# Patient Record
Sex: Male | Born: 1955 | Race: White | Hispanic: No | Marital: Married | Smoking: Never smoker
Health system: Southern US, Community
[De-identification: ages and names within clinical notes are randomized; demographics above are authoritative.]

## PROBLEM LIST (undated history)

## (undated) DIAGNOSIS — E7800 Pure hypercholesterolemia, unspecified: Secondary | ICD-10-CM

## (undated) DIAGNOSIS — N200 Calculus of kidney: Secondary | ICD-10-CM

## (undated) DIAGNOSIS — R748 Abnormal levels of other serum enzymes: Secondary | ICD-10-CM

## (undated) DIAGNOSIS — D8689 Sarcoidosis of other sites: Secondary | ICD-10-CM

## (undated) HISTORY — PX: CHOLECYSTECTOMY: SHX55

## (undated) HISTORY — PX: TONSILLECTOMY: SUR1361

---

## 2004-06-15 ENCOUNTER — Ambulatory Visit: Payer: Self-pay | Admitting: Family Medicine

## 2005-04-27 ENCOUNTER — Ambulatory Visit: Payer: Self-pay | Admitting: Family Medicine

## 2005-05-08 ENCOUNTER — Ambulatory Visit: Payer: Self-pay | Admitting: Family Medicine

## 2005-07-16 ENCOUNTER — Ambulatory Visit: Payer: Self-pay | Admitting: Family Medicine

## 2005-08-31 ENCOUNTER — Ambulatory Visit: Payer: Self-pay | Admitting: Family Medicine

## 2011-04-04 ENCOUNTER — Ambulatory Visit (HOSPITAL_COMMUNITY)
Admission: EM | Admit: 2011-04-04 | Discharge: 2011-04-04 | Disposition: A | Discharge status: Home / Self Care | Payer: Worker's Compensation | Attending: Orthopedic Surgery | Admitting: Orthopedic Surgery

## 2011-04-04 ENCOUNTER — Emergency Department (HOSPITAL_COMMUNITY): Payer: Worker's Compensation

## 2011-04-04 DIAGNOSIS — S61209A Unspecified open wound of unspecified finger without damage to nail, initial encounter: Secondary | ICD-10-CM | POA: Insufficient documentation

## 2011-04-04 DIAGNOSIS — X58XXXA Exposure to other specified factors, initial encounter: Secondary | ICD-10-CM | POA: Insufficient documentation

## 2011-04-04 DIAGNOSIS — S62639A Displaced fracture of distal phalanx of unspecified finger, initial encounter for closed fracture: Secondary | ICD-10-CM | POA: Insufficient documentation

## 2011-04-04 LAB — CBC
HCT: 48.7 % (ref 39.0–52.0)
Hemoglobin: 17 g/dL (ref 13.0–17.0)
MCV: 85.4 fL (ref 78.0–100.0)
WBC: 7.1 10*3/uL (ref 4.0–10.5)

## 2011-04-04 LAB — RAPID URINE DRUG SCREEN, HOSP PERFORMED
Barbiturates: NOT DETECTED
Benzodiazepines: NOT DETECTED
Cocaine: NOT DETECTED
Tetrahydrocannabinol: NOT DETECTED

## 2011-04-04 LAB — BASIC METABOLIC PANEL
BUN: 12 mg/dL (ref 6–23)
CO2: 23 mEq/L (ref 19–32)
Chloride: 102 mEq/L (ref 96–112)
GFR calc Af Amer: >90 mL/min (ref >90)
Potassium: 4.1 mEq/L (ref 3.5–5.1)

## 2011-04-18 NOTE — Op Note (Signed)
Javier Cruz, Javier Cruz NO.:  [id number]  MEDICAL RECORD NO.:  [id number]  LOCATION:  WLED                         FACILITY:  [facility]  PHYSICIAN:  Madelynn Done, MD  DATE OF BIRTH:  1956-03-23  DATE OF PROCEDURE:  04/04/2011 DATE OF DISCHARGE:                              OPERATIVE REPORT   PREOPERATIVE DIAGNOSES: 1. Open left thumb distal phalanx fracture. 2. Left thumb nail bed injury. 3. Traumatic laceration, 4.5 cm, left thumb.  POSTOPERATIVE DIAGNOSES: 1. Open left thumb distal phalanx fracture. 2. Left thumb nail bed injury. 3. Traumatic laceration, 4.5 cm, left thumb.  ATTENDING PHYSICIAN:  Sharma Covert IV, MD, who was scrubbed and present for the entire procedure.  ASSISTANT SURGEON:  None.  ANESTHESIA:  General via LMA.  TOURNIQUET TIME:  Close to 30 minutes, 250 mmHg.  SURGICAL PROCEDURES: 1. Open treatment of left thumb distal phalanx fracture with internal     fixation. 2. Repair of left thumb nail bed. 3. Debridement of skin, subcutaneous tissue, and bone associated with     open fracture of left thumb. 4. Radiographs, 2 views, left thumb. 5. Repair of traumatic laceration, 4.5 cm.  INTRAOPERATIVE FINDINGS:  The patient did have the avulsion type injury distally, and neurovascular pedicles were still in the large skin flap. The avulsion was very similar in appearance to how one would create a Moberg type flap taking the large skin off the flexor sheath and maintaining the neurovascular pedicles.  SURGICAL INDICATIONS:  Javier Cruz is a 55 year old right-hand-dominant gentleman who sustained a work-related injury to his left thumb.  The patient was seen and evaluated in the office, in the emergency department, and taken to the operating to undergo the above procedure. Risks, benefits, and alternatives were discussed in detail with the patient who signed and informed consent was obtained.  Risks to include, but not limited to  bleeding, infection; damage to nearby nerves, arteries, or tendons; nonunion; malunion; nail bed irregularity; loss of motion of thumb; and need for further surgical intervention.  DESCRIPTION OF PROCEDURE:  The patient was properly identified in the preop holding area, mark for a marker made on the left thumb to indicate correct operative site.  The patient was then brought back to the operating room, placed supine on anesthesia table where general anesthesia was administered.  The patient received preop antibiotics. Well-padded tourniquet was then placed on the left brachium and sealed with 1000 drape.  Left upper extremity was then prepped and draped in the normal sterile fashion.  Time-out was called.  Correct site was identified, and procedure then begun.  Attention was then turned to the left thumb.  Limb was then elevated and tourniquet insufflated.  The fracture hematoma was then evacuated.  Debridement of skin, subcutaneous tissue, and bone associated with open fracture was then carried out including several small pieces of the distal phalanx.  Once open debridement was then carried out, attention was then turned to fixation of the bone.  A 0.045 K-wire was then placed retrograde across the fracture site, maintaining a fine and good position, maintaining the thumb tip of the length.  Once this was carried out, repair  of the nail bed was then carried out using 5-0 simple chromic sutures.  After repair of the nail bed, the traumatic laceration was then closed with 4-0 Prolene sutures advancing skin flaps __________ appropriate alignment. After the closure, after each layer, after each procedure, the wound was then thoroughly irrigated.  Copious irrigation was done throughout. __________ the neurovascular pedicles were both in continuity. Following this, the K-wire was cut and final radiographs were then obtained using a C-arm, the Adaptic dressing was then applied.   Sterile compressive bandage then applied.  The patient was then placed in a well- padded thumb spica splint.  The patient was then extubated and taken to recovery room in good condition.  Intraoperative radiographs, 2 views, of the thumb do show the internal fixation in place, relatively good position with a comminuted bony fragment.  POSTOPERATIVE PLAN:  The patient will be discharged to home; seen back in the office in approximately 10 days, in 7 days for pin check, and pin out at the 3-week mark.  X-rays at each visit.  Tip protector splint; and then once the pin comes out, to a little bit more graduated __________ range of motion of the thumb.     Madelynn Done, MD     FWO/MEDQ  D:  04/04/2011  T:  04/04/2011  Job:  [id number]  Electronically Signed by Bradly Bienenstock IV MD on 04/18/2011 04:20:09 PM

## 2011-04-18 NOTE — H&P (Signed)
Javier, Cruz NO.:  [id number]  MEDICAL RECORD NO.:  [id number]  LOCATION:  1610                         FACILITY:  [facility]  PHYSICIAN:  Madelynn Done, MD  DATE OF BIRTH:  02/25/1956  DATE OF ADMISSION:  04/04/2011 DATE OF DISCHARGE:                             HISTORY & PHYSICAL   REASON FOR ADMISSION:  Left thumb open crush injury.  BRIEF HISTORY:  [name] is a 55 year old gentleman, who was at work sustaining an injury to his left thumb.  The patient had a several hundred pound beam come down and crush his left thumb.  Patient was seen and evaluated in the emergency department by Dr. Preston Fleeting and staff.  After being seen, I was asked to see and evaluate the patient.  After evaluation, indicated to be taken to the operating room to undergo reconstruction of his thumb.  No other complaints other than the left thumb.  No chest pain or loss of conscious.  No other complaints.  PAST MEDICAL HISTORY:  Reflux disease.  His past medications, allergies, social history, family history, and review of systems has been reviewed in the note by Remi Haggard, nurse practitioner, and Dr. Preston Fleeting.  PHYSICAL EXAMINATION:  GENERAL:  He is a healthy-appearing male. VITAL SIGN:  Temperature 98.7, blood pressure 169/100, heart rate of 120, respirations 18. NEUROLOGIC:  He has a normal mood. He is alert and oriented to person, place, and time, in no acute distress. HEENT:  Atraumatic.  Pupils equal, round.  Sclerae are white. NECK:  No visible masses. CHEST:  Equal chest excursion.  No audible wheezing. CARDIOVASCULAR:  Regular radial pulse.  Good blood flow distally in the tip of the finger. EXTREMITIES: On examination, in the left upper extremity, the patient does have the open wound to his left thumb along the volar radial margin with the laceration all the way down to the bone, insensate distal tip on the radial margin, distal to the zone of injury.  Sensation  is present on the ulnar aspect.  He is able to flex his thumb ip joint. Good capillary refill.  He does not have any injury to the index, long, ring, or small.  He has good wrist flexion, extension, as well as forearm rotation. His radiographs were reviewed, which did show a comminuted distal tuft fracture and associated soft tissue injury to the thumb.  IMPRESSION:  Left thumb open distal phalanx fracture with a digital nerve injury and nail bed injury.  PLAN:  Today, the findings were reviewed with the patient.  The patient will be taken to the operating room to undergo reconstruction of his thumb.  The debridement and repair as indicated to the nerves and bone. We talked about the risks of surgery to include, but not limited to bleeding, infection, damage to nearby nerves, arteries, or tendons, loss of motion of wrist and digits, and need for further surgical intervention.  All questions were answered, encouraged Lamine today, and the patient received a tetanus shot, IV antibiotics.  His last meal was early this morning.  The patient will be discharged to home.  He will not be admitted.  All questions were answered,  encouraged Shadman today.  Therin voiced understanding of plan and the reason for the intervention. Surgical decision making was carried out.     Madelynn Done, MD     FWO/MEDQ  D:  04/04/2011  T:  04/04/2011  Job:  [id number]  Electronically Signed by Bradly Bienenstock IV MD on 04/18/2011 04:19:46 PM

## 2012-03-14 DIAGNOSIS — Z23 Encounter for immunization: Secondary | ICD-10-CM

## 2013-04-13 ENCOUNTER — Ambulatory Visit: Payer: BC Managed Care – PPO

## 2017-04-25 DIAGNOSIS — Z01818 Encounter for other preprocedural examination: Secondary | ICD-10-CM

## 2021-08-14 ENCOUNTER — Other Ambulatory Visit: Payer: Self-pay

## 2021-08-14 ENCOUNTER — Emergency Department (HOSPITAL_BASED_OUTPATIENT_CLINIC_OR_DEPARTMENT_OTHER): Payer: Medicare HMO

## 2021-08-14 ENCOUNTER — Emergency Department (HOSPITAL_BASED_OUTPATIENT_CLINIC_OR_DEPARTMENT_OTHER)
Admission: EM | Admit: 2021-08-14 | Discharge: 2021-08-14 | Disposition: A | Discharge status: Home / Self Care | Payer: Medicare HMO | Attending: Emergency Medicine | Admitting: Emergency Medicine

## 2021-08-14 ENCOUNTER — Encounter (HOSPITAL_BASED_OUTPATIENT_CLINIC_OR_DEPARTMENT_OTHER): Payer: Self-pay | Admitting: Emergency Medicine

## 2021-08-14 DIAGNOSIS — R197 Diarrhea, unspecified: Secondary | ICD-10-CM | POA: Insufficient documentation

## 2021-08-14 DIAGNOSIS — R110 Nausea: Secondary | ICD-10-CM | POA: Insufficient documentation

## 2021-08-14 DIAGNOSIS — R42 Dizziness and giddiness: Secondary | ICD-10-CM | POA: Diagnosis not present

## 2021-08-14 DIAGNOSIS — R109 Unspecified abdominal pain: Secondary | ICD-10-CM | POA: Insufficient documentation

## 2021-08-14 HISTORY — DX: Abnormal levels of other serum enzymes: R74.8

## 2021-08-14 HISTORY — DX: Pure hypercholesterolemia, unspecified: E78.00

## 2021-08-14 HISTORY — DX: Sarcoidosis of other sites: D86.89

## 2021-08-14 HISTORY — DX: Calculus of kidney: N20.0

## 2021-08-14 LAB — COMPREHENSIVE METABOLIC PANEL
ALT: 29 U/L (ref 0–44)
AST: 23 U/L (ref 15–41)
Albumin: 4.3 g/dL (ref 3.5–5.0)
Alkaline Phosphatase: 83 U/L (ref 38–126)
Anion gap: 11 (ref 5–15)
BUN: 20 mg/dL (ref 8–23)
CO2: 27 mmol/L (ref 22–32)
Calcium: 9.9 mg/dL (ref 8.9–10.3)
Chloride: 99 mmol/L (ref 98–111)
Creatinine, Ser: 1.17 mg/dL (ref 0.61–1.24)
GFR, Estimated: >60 mL/min (ref >60)
Glucose, Bld: 126 mg/dL — ABNORMAL HIGH (ref 70–99)
Potassium: 4.1 mmol/L (ref 3.5–5.1)
Sodium: 137 mmol/L (ref 135–145)
Total Bilirubin: 0.9 mg/dL (ref 0.3–1.2)
Total Protein: 8.9 g/dL — ABNORMAL HIGH (ref 6.5–8.1)

## 2021-08-14 LAB — URINALYSIS, ROUTINE W REFLEX MICROSCOPIC
Bilirubin Urine: NEGATIVE
Glucose, UA: NEGATIVE mg/dL
Hgb urine dipstick: NEGATIVE
Ketones, ur: NEGATIVE mg/dL
Leukocytes,Ua: NEGATIVE
Nitrite: NEGATIVE
Protein, ur: 30 mg/dL — AB
Specific Gravity, Urine: >1.03 — ABNORMAL HIGH (ref 1.005–1.030)
pH: 5 (ref 5.0–8.0)

## 2021-08-14 LAB — URINALYSIS, MICROSCOPIC (REFLEX)

## 2021-08-14 LAB — CBC
HCT: 55.4 % — ABNORMAL HIGH (ref 39.0–52.0)
Hemoglobin: 18.7 g/dL — ABNORMAL HIGH (ref 13.0–17.0)
MCH: 29.8 pg (ref 26.0–34.0)
MCHC: 33.8 g/dL (ref 30.0–36.0)
MCV: 88.2 fL (ref 80.0–100.0)
Platelets: 258 10*3/uL (ref 150–400)
RBC: 6.28 MIL/uL — ABNORMAL HIGH (ref 4.22–5.81)
RDW: 13.1 % (ref 11.5–15.5)
WBC: 11.4 10*3/uL — ABNORMAL HIGH (ref 4.0–10.5)
nRBC: 0 % (ref 0.0–0.2)

## 2021-08-14 LAB — LIPASE, BLOOD: Lipase: 44 U/L (ref 11–51)

## 2021-08-14 MED ORDER — IOHEXOL 300 MG/ML  SOLN
100.0000 mL | Freq: Once | INTRAMUSCULAR | Status: AC | PRN
  Administered 2021-08-14: 100 mL via INTRAVENOUS

## 2021-08-14 MED ORDER — SODIUM CHLORIDE 0.9 % IV BOLUS
1000.0000 mL | Freq: Once | INTRAVENOUS | Status: AC
  Administered 2021-08-14: 1000 mL via INTRAVENOUS

## 2021-08-14 MED ORDER — DICYCLOMINE HCL 20 MG PO TABS: 20.0000 mg | ORAL_TABLET | Freq: Two times a day (BID) | ORAL | 0 refills | Status: AC

## 2021-08-14 MED ORDER — ONDANSETRON 4 MG PO TBDP: 4.0000 mg | ORAL_TABLET | Freq: Three times a day (TID) | ORAL | 0 refills | Status: AC | PRN

## 2021-08-14 NOTE — ED Notes (Signed)
Rx x 2 given  Written and verbal inst to pt  Verbalized an understanding  To home with family 

## 2021-08-14 NOTE — Discharge Instructions (Addendum)
Please follow-up with your primary care doctor.  I have given you the information for a gastroenterologist as well if you would like to follow-up with them.  I prescribed you muscle relaxer called Bentyl that may help with cramping.  Drink plenty of water you Zofran as needed for nausea.

## 2021-08-14 NOTE — ED Provider Notes (Signed)
[facility] EMERGENCY DEPARTMENT Provider Note   CSN: [id number] Arrival date & time: 08/14/21  1513     History  Chief Complaint  Patient presents with   Abdominal Pain    ROERT CULLIGAN is a 66 y.o. male.   Abdominal Pain Patient is a 66 year old male with past medical history significant for kidney stones, elevated LFTs, hepatic granuloma, high cholesterol  Patient presented emergency room today with right side abdominal discomfort ongoing for the whole day today.  Some brief episodes of feeling somewhat lightheaded no vomiting but does endorse some occasional episodes of nausea.  States he had 1 episode of dark green loose stool.  No fevers at home.  No chest pain difficulty breathing.  No recent antibiotics.  No other associate symptoms.  No aggravating or mitigating factors     Home Medications Prior to Admission medications   Medication Sig Start Date End Date Taking? Authorizing Provider  dicyclomine (BENTYL) 20 MG tablet Take 1 tablet (20 mg total) by mouth 2 (two) times daily. 08/14/21  Yes Demari Kropp S, PA  ondansetron (ZOFRAN-ODT) 4 MG disintegrating tablet Take 1 tablet (4 mg total) by mouth every 8 (eight) hours as needed for nausea or vomiting. 08/14/21  Yes Tedd Sias, PA      Allergies    Patient has no known allergies.    Review of Systems   Review of Systems  Gastrointestinal:  Positive for abdominal pain.   Physical Exam Updated Vital Signs BP 133/87 (BP Location: Right Arm)    Pulse 87    Temp (!) 97.4 F (36.3 C) (Oral)    Resp 16    Ht 5\' 9"  (1.753 m)    Wt 86.2 kg    SpO2 95%    BMI 28.06 kg/m  Physical Exam Vitals and nursing note reviewed.  Constitutional:      General: He is not in acute distress. HENT:     Head: Normocephalic and atraumatic.     Nose: Nose normal.  Eyes:     General: No scleral icterus. Cardiovascular:     Rate and Rhythm: Normal rate and regular rhythm.     Pulses: Normal pulses.     Heart sounds:  Normal heart sounds.  Pulmonary:     Effort: Pulmonary effort is normal. No respiratory distress.     Breath sounds: No wheezing.  Abdominal:     Palpations: Abdomen is soft.     Tenderness: There is abdominal tenderness.     Comments: R sided mild abd TTP  Musculoskeletal:     Cervical back: Normal range of motion.     Right lower leg: No edema.     Left lower leg: No edema.  Skin:    General: Skin is warm and dry.     Capillary Refill: Capillary refill takes less than 2 seconds.  Neurological:     Mental Status: He is alert. Mental status is at baseline.  Psychiatric:        Mood and Affect: Mood normal.        Behavior: Behavior normal.    ED Results / Procedures / Treatments   Labs (all labs ordered are listed, but only abnormal results are displayed) Labs Reviewed  COMPREHENSIVE METABOLIC PANEL - Abnormal; Notable for the following components:      Result Value   Glucose, Bld 126 (*)    Total Protein 8.9 (*)    All other components within normal limits  CBC - Abnormal;  Notable for the following components:   WBC 11.4 (*)    RBC 6.28 (*)    Hemoglobin 18.7 (*)    HCT 55.4 (*)    All other components within normal limits  URINALYSIS, ROUTINE W REFLEX MICROSCOPIC - Abnormal; Notable for the following components:   Specific Gravity, Urine >1.030 (*)    Protein, ur 30 (*)    All other components within normal limits  URINALYSIS, MICROSCOPIC (REFLEX) - Abnormal; Notable for the following components:   Bacteria, UA RARE (*)    All other components within normal limits  LIPASE, BLOOD    EKG None  Radiology CT ABDOMEN PELVIS W CONTRAST  Result Date: 08/14/2021 CLINICAL DATA:  Right-sided abdominal pain. EXAM: CT ABDOMEN AND PELVIS WITH CONTRAST TECHNIQUE: Multidetector CT imaging of the abdomen and pelvis was performed using the standard protocol following bolus administration of intravenous contrast. RADIATION DOSE REDUCTION: This exam was performed according to the  departmental dose-optimization program which includes automated exposure control, adjustment of the mA and/or kV according to patient size and/or use of iterative reconstruction technique. CONTRAST:  178mL OMNIPAQUE IOHEXOL 300 MG/ML  SOLN COMPARISON:  None. FINDINGS: Lower chest: The lung bases are clear of acute process. No pleural effusion or pulmonary lesions. The heart is normal in size. No pericardial effusion. The distal esophagus and aorta are unremarkable. Hepatobiliary: No hepatic lesions or intrahepatic biliary dilatation. The gallbladder is surgically absent. No common bile duct dilatation. Pancreas: No mass, inflammation or ductal dilatation. Spleen: Normal size.  No focal lesions. Adrenals/Urinary Tract: Adrenal glands are normal. No renal lesions or hydronephrosis. Bilateral parapelvic renal cysts are noted. The bladder is unremarkable. Stomach/Bowel: The stomach, duodenum, small bowel and are unremarkable. No acute inflammatory changes, mass lesions or obstructive findings. The terminal ileum is normal. The appendix is normal. Sigmoid colon diverticulosis but no findings for acute diverticulitis. Vascular/Lymphatic: Moderate age advanced atherosclerotic calcifications involving the aorta and iliac arteries and branch vessels but no aneurysm or dissection. The major venous structures are patent. No mesenteric or retroperitoneal mass or adenopathy. Reproductive: Mild prostate gland enlargement. The seminal vesicles are unremarkable. Other: Small amount of fluid or soft tissue density near the inguinal rings bilaterally could be related to prior inguinal hernia repair. No inguinal hernias. Prominent retroperitoneal fat. Musculoskeletal: No significant bony findings. IMPRESSION: 1. No acute abdominal/pelvic findings, mass lesions or adenopathy. 2. Status post cholecystectomy. No biliary dilatation. 3. Bilateral parapelvic renal cysts. 4. Moderate age advanced atherosclerotic calcifications involving the  aorta and iliac arteries and branch vessels. Aortic Atherosclerosis (ICD10-I70.0). Electronically Signed   By: Marijo Sanes M.D.   On: 08/14/2021 18:25    Procedures Procedures    Medications Ordered in ED Medications  sodium chloride 0.9 % bolus 1,000 mL (0 mLs Intravenous Stopped 08/14/21 1930)  iohexol (OMNIPAQUE) 300 MG/ML solution 100 mL (100 mLs Intravenous Contrast Given 08/14/21 1800)    ED Course/ Medical Decision Making/ A&P Clinical Course as of 08/14/21 2201  Mon Aug 14, 2021  1742 Protein(!): 30 [WF]  1743 Specific Gravity, Urine(!): >1.030 [WF]  1848 IMPRESSION: 1. No acute abdominal/pelvic findings, mass lesions or adenopathy. 2. Status post cholecystectomy. No biliary dilatation. 3. Bilateral parapelvic renal cysts. 4. Moderate age advanced atherosclerotic calcifications involving the aorta and iliac arteries and branch vessels.   [WF]  [phone] Personally reviewed CT imaging and agree with radiology read no acute abnormality. [WF]    Clinical Course User Index [WF] Tedd Sias, [place]  Medical Decision Making Amount and/or Complexity of Data Reviewed Labs: ordered. Decision-making details documented in ED Course. Radiology: ordered.  Risk Prescription drug management.   Patient is a 66 year old male presented to ER today with abdominal pain  This patient presents to the ED for concern of normal pain, this involves a number of treatment options, and is a complaint that carries with it a high risk of complications and morbidity.  The differential diagnosis includes The causes of generalized abdominal pain include but are not limited to AAA, mesenteric ischemia, appendicitis, diverticulitis, DKA, gastritis, gastroenteritis, AMI, nephrolithiasis, pancreatitis, peritonitis, adrenal insufficiency,lead poisoning, iron toxicity, intestinal ischemia, constipation, UTI,SBO/LBO, splenic rupture, biliary disease, IBD, IBS, PUD, or hepatitis.  Co  morbidities: Discussed in HPI   Brief History:  Seems she has had some right-sided abdominal pain felt somewhat lightheaded this morning.  No chest pain difficulty breathing   Physical exam relatively unremarkable not significantly tender but endorsing right-sided abdominal pain.  Pain is specifically very mild on his presentation/endorsement.  I have very low suspicion for something sinister such as mesenteric ischemia or other acute emergent medical condition.  However given age and symptoms will obtain CT abdomen pelvis with contrast  EMR reviewed including pt PMHx, past surgical history and past visits to ER.   See HPI for more details   Lab Tests:  I ordered and independently interpreted labs.  The pertinent results include:    Labs notable for elevated RBC and WBC and elevated specific gravity consistent with dehydration some protein in urine which she will need to follow-up with PCP about.   Imaging Studies:  NAD. I personally reviewed all imaging studies and no acute abnormality found. I agree with radiology interpretation.  I personally reviewed all images of CT abdomen pelvis.  I do not appreciate any acute abnormality.  Some atherosclerotic changes were found which I discussed with patient will need to follow-up with PCP to discuss this as well  Cardiac Monitoring:  NA NA   Medicines ordered:  I ordered medication including normal saline for dehydration Reevaluation of the patient after these medicines showed that the patient improved I have reviewed the patients home medicines and have made adjustments as needed   Critical Interventions:     Attending:  I discussed this case with my attending physician who cosigned this note including patient's presenting symptoms, physical exam, and planned diagnostics and interventions. Attending physician stated agreement with plan or made changes to plan which were implemented.     Reevaluation:  After the  interventions noted above I re-evaluated patient and found that they have :improved   Social Determinants of Health:  The patient's social determinants of health were a factor in the care of this patient    Problem List / ED Course:  Abdominal pain Nausea  Patient with relatively vague history of some right-sided abdominal discomfort.  He has a somewhat protuberant abdomen.  I do not appreciate a whole lot of distention and a focal area that he is having pain which he seems to have noticed.  Physical exam overall relatively unremarkable.  CT without acute finding.  Discussed with my attending physician who is agreement with my plan.  Will discharge home he understands the importance of close follow-up with primary care.  Dispostion:  After consideration of the diagnostic results and the patients response to treatment, I feel that the patent would benefit from discharge home with conservative therapy Bentyl Zofran return precautions follow-up with PCP  Final Clinical Impression(s) / ED Diagnoses Final diagnoses:  Abdominal pain, unspecified abdominal location    Rx / DC Orders ED Discharge Orders          Ordered    ondansetron (ZOFRAN-ODT) 4 MG disintegrating tablet  Every 8 hours PRN        08/14/21 1918    dicyclomine (BENTYL) 20 MG tablet  2 times daily        08/14/21 1918              Pati Gallo [place], [place] 08/15/21 0001    Lucrezia Starch, MD 08/19/21 [phone]

## 2021-08-14 NOTE — ED Triage Notes (Signed)
Right sided abdominal pain.  Pt states some nausea and diarrhea.  Dark green stool.  No fevers.  Some dizziness since this am.

## 2021-08-14 NOTE — ED Notes (Signed)
Patient transported to CT 

## 2022-01-22 ENCOUNTER — Emergency Department (HOSPITAL_BASED_OUTPATIENT_CLINIC_OR_DEPARTMENT_OTHER): Payer: Medicare HMO

## 2022-01-22 ENCOUNTER — Other Ambulatory Visit: Payer: Self-pay

## 2022-01-22 ENCOUNTER — Encounter (HOSPITAL_BASED_OUTPATIENT_CLINIC_OR_DEPARTMENT_OTHER): Payer: Self-pay | Admitting: Emergency Medicine

## 2022-01-22 ENCOUNTER — Emergency Department (HOSPITAL_BASED_OUTPATIENT_CLINIC_OR_DEPARTMENT_OTHER)
Admission: EM | Admit: 2022-01-22 | Discharge: 2022-01-22 | Disposition: A | Discharge status: Home / Self Care | Payer: Medicare HMO | Attending: Emergency Medicine | Admitting: Emergency Medicine

## 2022-01-22 DIAGNOSIS — N200 Calculus of kidney: Secondary | ICD-10-CM

## 2022-01-22 DIAGNOSIS — N132 Hydronephrosis with renal and ureteral calculous obstruction: Secondary | ICD-10-CM | POA: Diagnosis not present

## 2022-01-22 DIAGNOSIS — R109 Unspecified abdominal pain: Secondary | ICD-10-CM | POA: Diagnosis present

## 2022-01-22 LAB — CBC WITH DIFFERENTIAL/PLATELET
Abs Immature Granulocytes: 0.03 10*3/uL (ref 0.00–0.07)
Basophils Absolute: 0 10*3/uL (ref 0.0–0.1)
Basophils Relative: 0 %
Eosinophils Absolute: 0.1 10*3/uL (ref 0.0–0.5)
Eosinophils Relative: 1 %
HCT: 50 % (ref 39.0–52.0)
Hemoglobin: 17.3 g/dL — ABNORMAL HIGH (ref 13.0–17.0)
Immature Granulocytes: 0 %
Lymphocytes Relative: 15 %
Lymphs Abs: 1.6 10*3/uL (ref 0.7–4.0)
MCH: 29.9 pg (ref 26.0–34.0)
MCHC: 34.6 g/dL (ref 30.0–36.0)
MCV: 86.5 fL (ref 80.0–100.0)
Monocytes Absolute: 0.9 10*3/uL (ref 0.1–1.0)
Monocytes Relative: 8 %
Neutro Abs: 8 10*3/uL — ABNORMAL HIGH (ref 1.7–7.7)
Neutrophils Relative %: 76 %
Platelets: 249 10*3/uL (ref 150–400)
RBC: 5.78 MIL/uL (ref 4.22–5.81)
RDW: 12.4 % (ref 11.5–15.5)
WBC: 10.5 10*3/uL (ref 4.0–10.5)
nRBC: 0 % (ref 0.0–0.2)

## 2022-01-22 LAB — LIPASE, BLOOD: Lipase: 30 U/L (ref 11–51)

## 2022-01-22 LAB — COMPREHENSIVE METABOLIC PANEL
ALT: 26 U/L (ref 0–44)
AST: 23 U/L (ref 15–41)
Albumin: 3.7 g/dL (ref 3.5–5.0)
Alkaline Phosphatase: 73 U/L (ref 38–126)
Anion gap: 8 (ref 5–15)
BUN: 20 mg/dL (ref 8–23)
CO2: 26 mmol/L (ref 22–32)
Calcium: 9.1 mg/dL (ref 8.9–10.3)
Chloride: 106 mmol/L (ref 98–111)
Creatinine, Ser: 1.15 mg/dL (ref 0.61–1.24)
GFR, Estimated: >60 mL/min (ref >60)
Glucose, Bld: 115 mg/dL — ABNORMAL HIGH (ref 70–99)
Potassium: 4 mmol/L (ref 3.5–5.1)
Sodium: 140 mmol/L (ref 135–145)
Total Bilirubin: 0.9 mg/dL (ref 0.3–1.2)
Total Protein: 7.5 g/dL (ref 6.5–8.1)

## 2022-01-22 LAB — URINALYSIS, ROUTINE W REFLEX MICROSCOPIC
Bilirubin Urine: NEGATIVE
Glucose, UA: NEGATIVE mg/dL
Ketones, ur: NEGATIVE mg/dL
Leukocytes,Ua: NEGATIVE
Nitrite: NEGATIVE
Protein, ur: 30 mg/dL — AB
Specific Gravity, Urine: 1.025 (ref 1.005–1.030)
pH: 7 (ref 5.0–8.0)

## 2022-01-22 LAB — URINALYSIS, MICROSCOPIC (REFLEX): RBC / HPF: >50 RBC/hpf (ref 0–5)

## 2022-01-22 MED ORDER — ONDANSETRON HCL 4 MG/2ML IJ SOLN
4.0000 mg | Freq: Once | INTRAMUSCULAR | Status: AC
  Administered 2022-01-22: 4 mg via INTRAVENOUS
  Filled 2022-01-22: qty 2

## 2022-01-22 MED ORDER — ONDANSETRON 4 MG PO TBDP: 4.0000 mg | ORAL_TABLET | Freq: Once | ORAL | Status: DC

## 2022-01-22 MED ORDER — TAMSULOSIN HCL 0.4 MG PO CAPS: 0.4000 mg | ORAL_CAPSULE | Freq: Every day | ORAL | 0 refills | Status: AC

## 2022-01-22 NOTE — ED Notes (Signed)
LACTIC ACID 2.2 - ED MD INFORMED

## 2022-01-22 NOTE — ED Provider Notes (Cosign Needed Addendum)
[facility] EMERGENCY DEPARTMENT Provider Note   CSN: [id number] Arrival date & time: 01/22/22  1022     History  Chief Complaint  Patient presents with   Flank Pain    Javier Cruz is a 66 y.o. male with a history of renal calculi and diverticulosis who presents with a week of left flank pain.  The pain is well localized and nonradiating.  He has not taken any medicine for it.  It is described as "the same as when I had a kidney stone on the right side".  It is severe.  It is associated with nausea and retching.  He visited PCP for this problem last week.  UA then showed hematuria.  X-ray was negative per patient report.  He was set up for a CT scan to be performed on 01/24/2022, but symptoms progressed to the point where he felt like he could not wait.  He reports a history of obstructing renal calculus on the right requiring lithotripsy.  He has had 2 episodes of renal calculi in the past.  He endorses decreased urinary volume.  No dysuria.  He endorses some sweats and chills associated with episodes of nausea and retching.  He denies chest pain, shortness of breath.  He denies abdominal pain, diarrhea.  Social history: Patient is retired.  Lives in between [place] and [place].  Patient works out frequently.  Patient does not drink alcohol, use tobacco, or drugs.   Flank Pain       Home Medications Prior to Admission medications   Medication Sig Start Date End Date Taking? Authorizing Provider  tamsulosin (FLOMAX) 0.4 MG CAPS capsule Take 1 capsule (0.4 mg total) by mouth daily. 01/22/22  Yes Marrianne Mood, MD  dicyclomine (BENTYL) 20 MG tablet Take 1 tablet (20 mg total) by mouth 2 (two) times daily. 08/14/21   Gailen Shelter, PA  ondansetron (ZOFRAN-ODT) 4 MG disintegrating tablet Take 1 tablet (4 mg total) by mouth every 8 (eight) hours as needed for nausea or vomiting. 08/14/21   Gailen Shelter, PA      Allergies    Patient has no known allergies.     Review of Systems   Review of Systems  Genitourinary:  Positive for flank pain.    Physical Exam Updated Vital Signs BP (!) 153/83   Pulse 83   Temp 97.6 F (36.4 C) (Oral)   Resp 17   Ht 5\' 9"  (1.753 m)   Wt 88 kg   SpO2 97%   BMI 28.65 kg/m  Physical Exam  ED Results / Procedures / Treatments   Labs (all labs ordered are listed, but only abnormal results are displayed) Labs Reviewed  CBC WITH DIFFERENTIAL/PLATELET - Abnormal; Notable for the following components:      Result Value   Hemoglobin 17.3 (*)    Neutro Abs 8.0 (*)    All other components within normal limits  URINALYSIS, ROUTINE W REFLEX MICROSCOPIC - Abnormal; Notable for the following components:   APPearance HAZY (*)    Hgb urine dipstick LARGE (*)    Protein, ur 30 (*)    All other components within normal limits  LACTIC ACID, PLASMA - Abnormal; Notable for the following components:   Lactic Acid, Venous 2.2 (*)    All other components within normal limits  URINALYSIS, MICROSCOPIC (REFLEX) - Abnormal; Notable for the following components:   Bacteria, UA RARE (*)    All other components within normal limits  COMPREHENSIVE METABOLIC  PANEL - Abnormal; Notable for the following components:   Glucose, Bld 115 (*)    All other components within normal limits  URINE CULTURE  LIPASE, BLOOD    EKG None  Radiology CT Renal Stone Study  Result Date: 01/22/2022 CLINICAL DATA:  Left flank pain for 1 week. History of kidney stones. Urinary frequency. Nausea and vomiting. EXAM: CT ABDOMEN AND PELVIS WITHOUT CONTRAST TECHNIQUE: Multidetector CT imaging of the abdomen and pelvis was performed following the standard protocol without IV contrast. RADIATION DOSE REDUCTION: This exam was performed according to the departmental dose-optimization program which includes automated exposure control, adjustment of the mA and/or kV according to patient size and/or use of iterative reconstruction technique. COMPARISON:   08/14/2021. FINDINGS: Lower chest: Scattered subsegmental volume loss. Heart size normal. No pericardial or pleural effusion. Distal esophagus is unremarkable. Hepatobiliary: Liver is unremarkable. Cholecystectomy. No biliary ductal dilatation. Pancreas: Negative. Spleen: Negative. Adrenals/Urinary Tract: Adrenal glands are unremarkable. There may be renal sinus cysts on the right. No follow-up necessary. 1.4 cm low-attenuation lesion in the interpolar kidney, too small to characterize but likely a cyst. No follow-up necessary. Moderate left hydronephrosis with left periureteric stranding, secondary to a 3 mm stone in the bladder, at the left ureteral orifice. Bladder is otherwise grossly unremarkable. Stomach/Bowel: Tiny hiatal hernia. Stomach, small-bowel, appendix and colon are unremarkable. Vascular/Lymphatic: Atherosclerotic calcification of the aorta. No pathologically enlarged lymph nodes. Reproductive: Prostate is enlarged. Other: No free fluid.  Mesenteries and peritoneum are unremarkable. Musculoskeletal: Degenerative changes in the spine. No worrisome lytic or sclerotic lesions. Probable bone island in the left femoral head. IMPRESSION: 1. Moderate left hydronephrosis secondary to passage of a 3 mm stone in the bladder, sitting at the left ureteral orifice. 2. Enlarged prostate. 3.  Aortic atherosclerosis (ICD10-I70.0). Electronically Signed   By: Leanna Battles M.D.   On: 01/22/2022 11:29    Medications Ordered in ED Medications  ondansetron (ZOFRAN) injection 4 mg (4 mg Intravenous Given 01/22/22 1154)    ED Course/ Medical Decision Making/ A&P Clinical Course as of 01/22/22 1454  Mon Jan 22, 2022  3255 66 year old male with history of obstructing renal stones presents with a week of flank pain and hematuria.  Afebrile and hemodynamically stable.  CT scan shows 3 mm stone in the bladder, mild left hydronephrosis and periureteral stranding.  Lab work-up notable for a lactate of 2.2, otherwise  not concerning for infection.  Currently awaiting metabolic panel to assess kidney function.  Based on imaging, believe stone is out of the ureter.  Do not suspect systemic illness at this time. [MM]  1426 CMP with normal creatinine. [MM]    Clinical Course User Index [MM] Marrianne Mood, MD                           Medical Decision Making Amount and/or Complexity of Data Reviewed Labs: ordered. Radiology: ordered.  Risk Prescription drug management.   Keyion Knack is a 66 year old male with history of obstructing renal stone on the right requiring intervention who presents with a week of left-sided flank pain described as similar to previous episodes of kidney stones.  Afebrile currently hemodynamically stable.  This involves an extensive number of treatment options, and is a complaint that carries with it a high risk of complications and morbidity.  The differential diagnosis includes renal stone with or without infection, with or without obstruction, pyelonephritis, diverticulitis.   Co morbidities that complicate the patient evaluation  History of renal stones.   Social Determinants of Health:  Does not drink alcohol, use tobacco, or drugs.   Additional history obtained:  Additional history and/or information obtained from partner/family at bedside  Lab Tests:  I Ordered (or co-signed), and personally interpreted labs.  The pertinent results include:   CBC: WNL CMP: WNL Lipase: WNL Lactate: 2.2 Urinalysis: hematuria Urine culture: pending   Imaging Studies ordered:  I ordered (or co-signed) imaging studies including CT renal stone  I independently visualized and interpreted imaging which showed stone in the bladder, mild left hydronephrosis, periureteral stranding I agree with the radiologist interpretation  Medicines ordered and prescription drug management:  I ordered medication including Zofran IV for nausea Reevaluation of the patient after these  medicines showed that the patient improved  Reevaluation:  After the interventions noted above, I reevaluated the patient and found that they have :improved.   Dispostion:  After consideration of the diagnostic results and the patients response to treatment, I feel that the patent would benefit from discharge to home and follow-up with PCP/urology for evaluation and counseling to help prevent future renal stones.  Patient is well-appearing and does not seem systemically ill.  I believe the stone passed prior to or shortly after his arrival to the ED.  His symptoms resolved without any intervention on my part.  His elevated lactate may be explained by decreased excretion over the last several days due to left renal obstruction and dehydration.  Final Clinical Impression(s) / ED Diagnoses Final diagnoses:  Kidney stone    Rx / DC Orders ED Discharge Orders          Ordered    tamsulosin (FLOMAX) 0.4 MG CAPS capsule  Daily        01/22/22 1424            Marrianne Mood, MD 01/22/22 1452    Marrianne Mood, MD 01/22/22 1455    Tegeler, Canary Brim, MD 01/24/22 1615

## 2022-01-22 NOTE — ED Triage Notes (Signed)
Left flank pain x 1 weekago has hx of kidney stones having frequency, some nn/v this am

## 2022-01-23 LAB — URINE CULTURE: Culture: NO GROWTH

## 2022-01-23 LAB — LACTIC ACID, PLASMA: Lactic Acid, Venous: 2.2 mmol/L (ref 0.5–1.9)

## 2022-12-07 IMAGING — CT CT ABD-PELV W/ CM
2 of 5 series · 15 of 46 positions shown, 17 images · IV contrast (Omnipaque)
Comparison: None.

CLINICAL DATA: Right-sided abdominal pain.

EXAM:
CT ABDOMEN AND PELVIS WITH CONTRAST
TECHNIQUE: Multidetector CT imaging of the abdomen and pelvis was performed
using the standard protocol following bolus administration of
intravenous contrast.

[Series 2: axial st · axial · 0.79mm/px · z∈[-870,-420]mm · 12 of 102 slices shown, 14 images]
[im 6/102  soft-tissue]
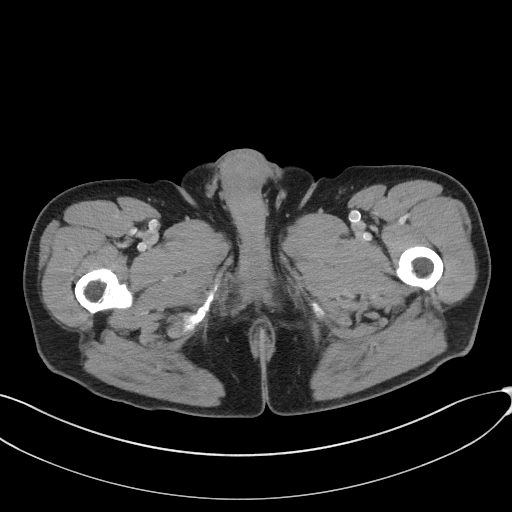
[im 6/102  bone]
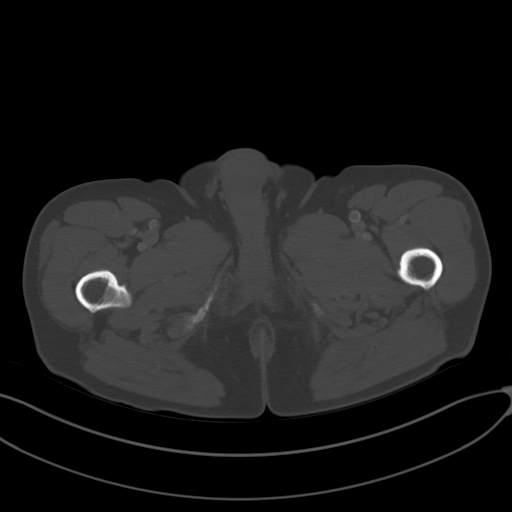
[im 16/102  soft-tissue]
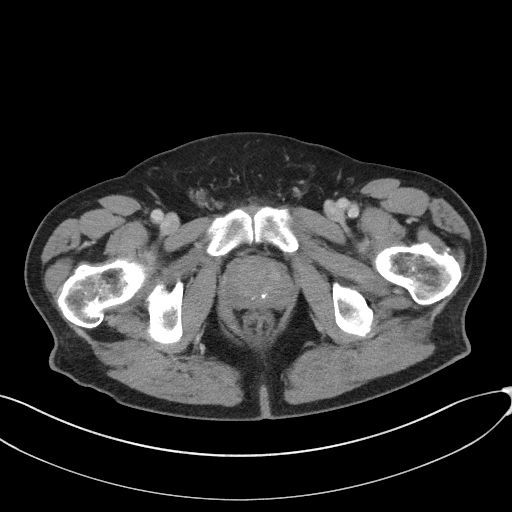
[im 21/102  soft-tissue]
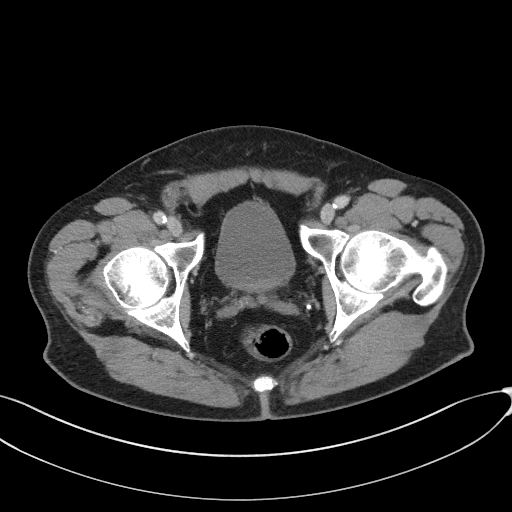
[im 31/102  soft-tissue]
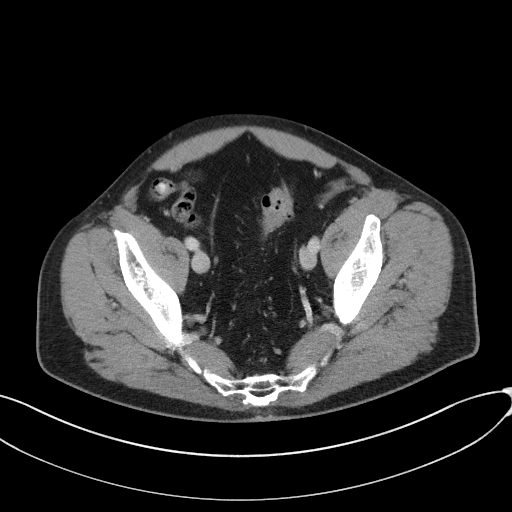
[im 41/102  soft-tissue]
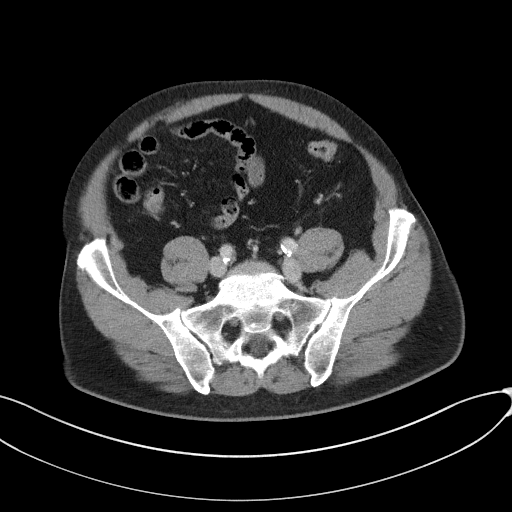
[im 46/102  soft-tissue]
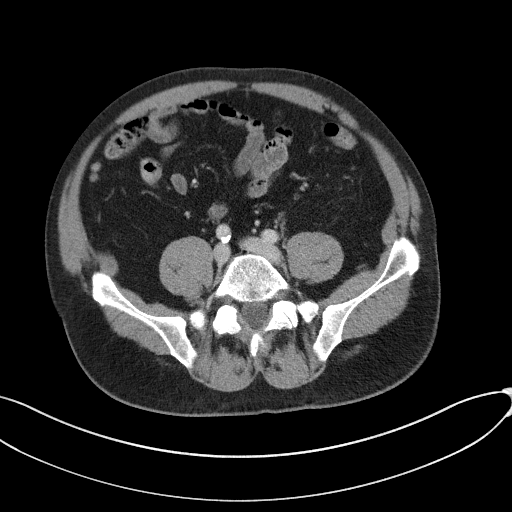
[im 56/102  soft-tissue]
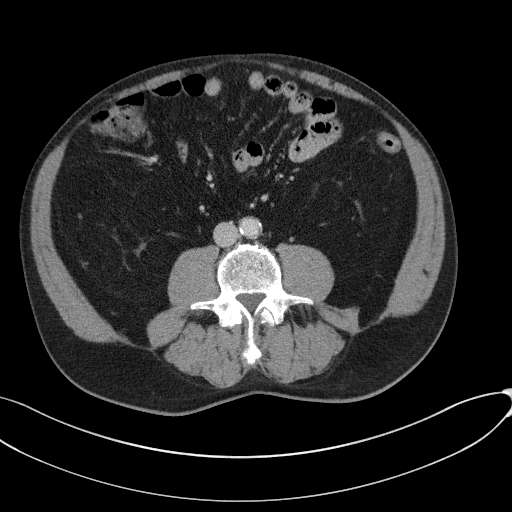
[im 61/102  soft-tissue]
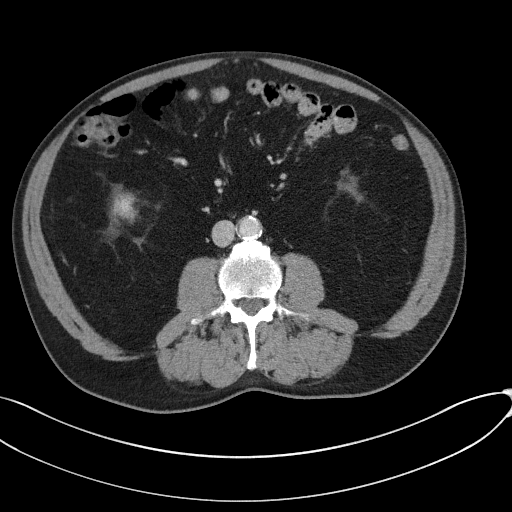
[im 71/102  soft-tissue]
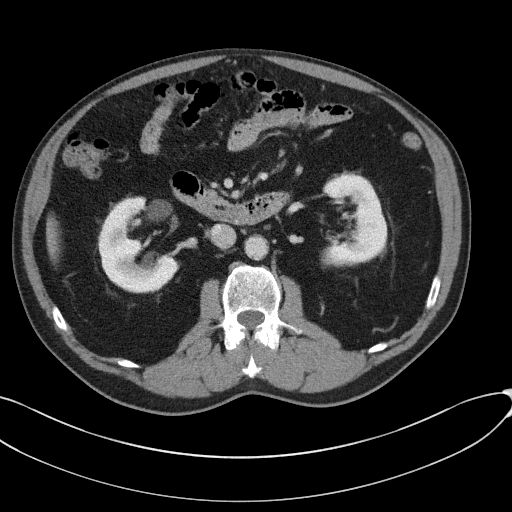
[im 71/102  bone]
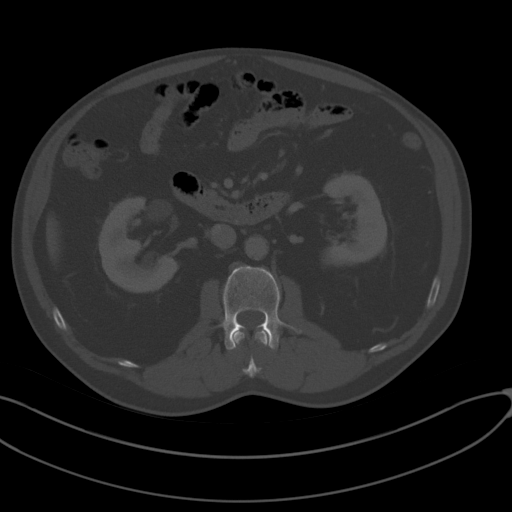
[im 81/102  soft-tissue]
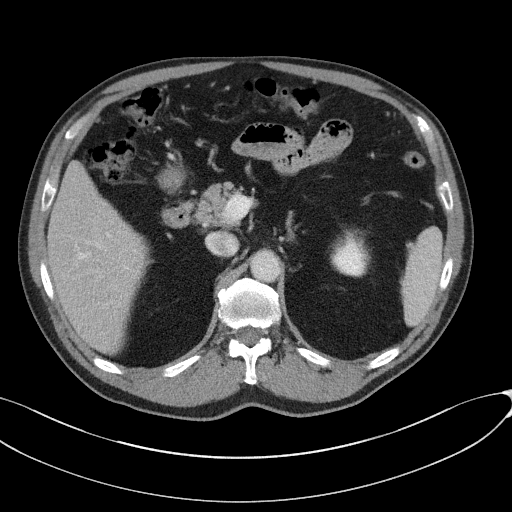
[im 86/102  soft-tissue]
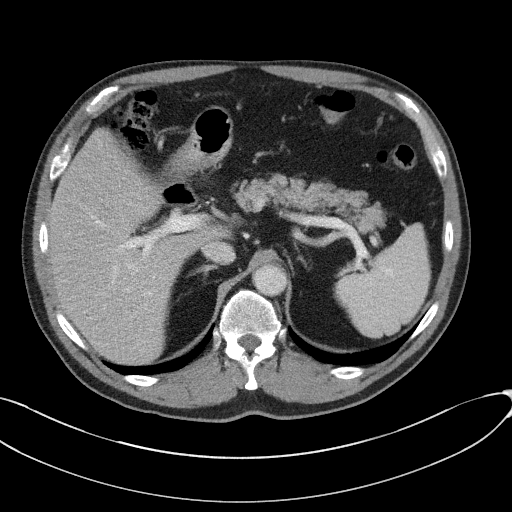
[im 96/102  soft-tissue]
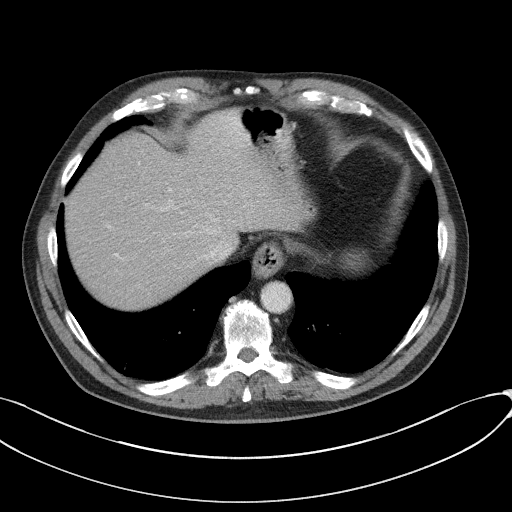

[Series 5: coronal st · coronal · 0.75mm/px · 3 of 99 slices shown]
[im 33/99  soft-tissue]
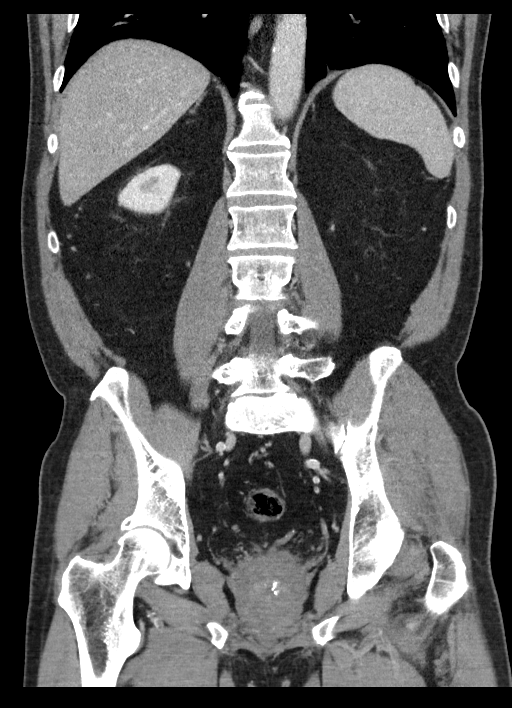
[im 44/99  soft-tissue]
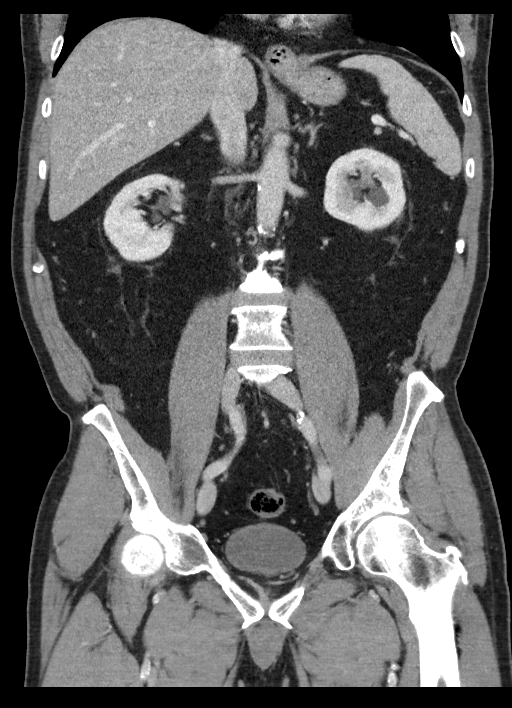
[im 55/99  soft-tissue]
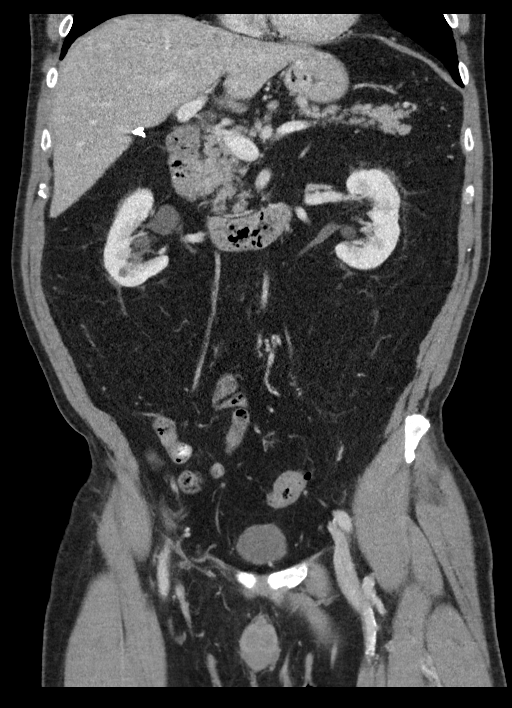

[15 of 46 positions shown; findings below may reference images not displayed]

RADIATION DOSE REDUCTION: This exam was performed according to the
departmental dose-optimization program which includes automated
exposure control, adjustment of the mA and/or kV according to
patient size and/or use of iterative reconstruction technique.

CONTRAST:  100mL OMNIPAQUE IOHEXOL 300 MG/ML  SOLN
FINDINGS: Lower chest: The lung bases are clear of acute process. No pleural
effusion or pulmonary lesions. The heart is normal in size. No
pericardial effusion. The distal esophagus and aorta are
unremarkable.

Hepatobiliary: No hepatic lesions or intrahepatic biliary
dilatation. The gallbladder is surgically absent. No common bile
duct dilatation.

Pancreas: No mass, inflammation or ductal dilatation.

Spleen: Normal size.  No focal lesions.

Adrenals/Urinary Tract: Adrenal glands are normal.

No renal lesions or hydronephrosis. Bilateral parapelvic renal cysts
are noted. The bladder is unremarkable.

Stomach/Bowel: The stomach, duodenum, small bowel and are
unremarkable. No acute inflammatory changes, mass lesions or
obstructive findings. The terminal ileum is normal. The appendix is
normal. Sigmoid colon diverticulosis but no findings for acute
diverticulitis.

Vascular/Lymphatic: Moderate age advanced atherosclerotic
calcifications involving the aorta and iliac arteries and branch
vessels but no aneurysm or dissection. The major venous structures
are patent. No mesenteric or retroperitoneal mass or adenopathy.

Reproductive: Mild prostate gland enlargement. The seminal vesicles
are unremarkable.

Other: Small amount of fluid or soft tissue density near the
inguinal rings bilaterally could be related to prior inguinal hernia
repair. No inguinal hernias. Prominent retroperitoneal fat.

Musculoskeletal: No significant bony findings.
IMPRESSION: 1. No acute abdominal/pelvic findings, mass lesions or adenopathy.
2. Status post cholecystectomy. No biliary dilatation.
3. Bilateral parapelvic renal cysts.
4. Moderate age advanced atherosclerotic calcifications involving
the aorta and iliac arteries and branch vessels.

Aortic Atherosclerosis ([id number]-[id number]).
